# Patient Record
Sex: Male | Born: 1975 | Race: White | Hispanic: No | Marital: Married | Smoking: Former smoker
Health system: Southern US, Community
[De-identification: ages and names within clinical notes are randomized; demographics above are authoritative.]

## PROBLEM LIST (undated history)

## (undated) DIAGNOSIS — K219 Gastro-esophageal reflux disease without esophagitis: Secondary | ICD-10-CM

## (undated) DIAGNOSIS — F419 Anxiety disorder, unspecified: Secondary | ICD-10-CM

## (undated) DIAGNOSIS — I10 Essential (primary) hypertension: Secondary | ICD-10-CM

## (undated) HISTORY — DX: Essential (primary) hypertension: I10

## (undated) HISTORY — DX: Gastro-esophageal reflux disease without esophagitis: K21.9

## (undated) HISTORY — PX: OTHER SURGICAL HISTORY: SHX169

## (undated) HISTORY — DX: Anxiety disorder, unspecified: F41.9

---

## 2002-02-12 HISTORY — PX: CRANIOTOMY: SHX93

## 2012-07-14 ENCOUNTER — Telehealth: Payer: Self-pay | Admitting: Family Medicine

## 2012-07-14 ENCOUNTER — Ambulatory Visit (INDEPENDENT_AMBULATORY_CARE_PROVIDER_SITE_OTHER): Admitting: Family Medicine

## 2012-07-14 DIAGNOSIS — Z23 Encounter for immunization: Secondary | ICD-10-CM

## 2012-07-14 NOTE — Telephone Encounter (Signed)
APPT MADE FOR NURSES- [facility]- TDAP ONLY

## 2012-07-23 ENCOUNTER — Ambulatory Visit (INDEPENDENT_AMBULATORY_CARE_PROVIDER_SITE_OTHER): Admitting: Family Medicine

## 2012-07-23 ENCOUNTER — Telehealth: Payer: Self-pay | Admitting: Nurse Practitioner

## 2012-07-23 ENCOUNTER — Encounter: Payer: Self-pay | Admitting: Family Medicine

## 2012-07-23 VITALS — BP 134/76 | HR 71 | Temp 97.5°F | Ht 70.0 in | Wt 196.2 lb

## 2012-07-23 DIAGNOSIS — L03116 Cellulitis of left lower limb: Secondary | ICD-10-CM

## 2012-07-23 DIAGNOSIS — S91332A Puncture wound without foreign body, left foot, initial encounter: Secondary | ICD-10-CM

## 2012-07-23 DIAGNOSIS — L03119 Cellulitis of unspecified part of limb: Secondary | ICD-10-CM

## 2012-07-23 DIAGNOSIS — S91309A Unspecified open wound, unspecified foot, initial encounter: Secondary | ICD-10-CM

## 2012-07-23 MED ORDER — DOXYCYCLINE HYCLATE 100 MG PO CAPS: 100.0000 mg | ORAL_CAPSULE | Freq: Two times a day (BID) | ORAL | Status: DC

## 2012-07-23 NOTE — Progress Notes (Signed)
  Subjective:    Patient ID: Darren Johnson, male    DOB: Nov 21, 1975, 37 y.o.   MRN: [id number]  HPI Patient comes in today for followup of nail puncture wounds to the left plantar foot area. He came in and received a tetanus shot and was doing well until the past couple of days when his foot became more red swollen and developed some drainage. The redness and erythema has increased today compared to yesterday. He is allergic to penicillin and that hasn't child he had a rash from penicillin.   Review of Systems  Skin: Positive for wound (bottom of L foot stepped on nail > than 1 week ago, now tender with redness).       Objective:   Physical Exam Cellulitis of left lateral foot. Some warmth. Some tenderness. No drainage today.       Assessment & Plan:  1. Puncture wound of left foot, initial encounter  2. Cellulitis of left foot --Doxycycline 100 mg twice daily with food for 2 weeks  Patient Instructions  Soak foot as frequently as possible for 10-15 minutes in warm salty water Elevate foot when at home If any more drainage, do culture and sensitivity on drainage Return to clinic in 5 days for recheck of foot . Return to clinic sooner if worse

## 2012-07-23 NOTE — Patient Instructions (Addendum)
Soak foot as frequently as possible for 10-15 minutes in warm salty water Elevate foot when at home If any more drainage, do culture and sensitivity on drainage Return to clinic in 5 days for recheck of foot . Return to clinic sooner if worse

## 2012-07-23 NOTE — Telephone Encounter (Signed)
APPT MADE

## 2012-07-28 ENCOUNTER — Ambulatory Visit: Admitting: Physician Assistant

## 2012-08-27 ENCOUNTER — Other Ambulatory Visit: Payer: Self-pay | Admitting: Family Medicine

## 2012-09-25 ENCOUNTER — Other Ambulatory Visit: Payer: Self-pay | Admitting: Family Medicine

## 2013-03-09 ENCOUNTER — Other Ambulatory Visit: Payer: Self-pay | Admitting: Family Medicine

## 2013-03-16 ENCOUNTER — Other Ambulatory Visit: Payer: Self-pay | Admitting: Family Medicine

## 2013-03-16 ENCOUNTER — Telehealth: Payer: Self-pay | Admitting: Family Medicine

## 2013-03-17 MED ORDER — DEXLANSOPRAZOLE 60 MG PO CPDR: 60.0000 mg | DELAYED_RELEASE_CAPSULE | Freq: Every day | ORAL | Status: DC

## 2013-03-17 NOTE — Telephone Encounter (Signed)
done

## 2013-04-13 ENCOUNTER — Other Ambulatory Visit: Payer: Self-pay | Admitting: Family Medicine

## 2013-04-15 NOTE — Telephone Encounter (Signed)
Last seen 07/23/12  [facility]

## 2013-05-12 ENCOUNTER — Other Ambulatory Visit: Payer: Self-pay | Admitting: Family Medicine

## 2013-06-12 ENCOUNTER — Other Ambulatory Visit: Payer: Self-pay | Admitting: Family Medicine

## 2014-08-10 ENCOUNTER — Telehealth: Payer: Self-pay | Admitting: Family Medicine

## 2014-08-11 ENCOUNTER — Telehealth: Payer: Self-pay | Admitting: Family Medicine

## 2014-08-12 ENCOUNTER — Ambulatory Visit: Admitting: Family Medicine

## 2014-09-20 ENCOUNTER — Telehealth: Payer: Self-pay | Admitting: Family Medicine

## 2017-05-22 ENCOUNTER — Telehealth: Payer: Self-pay | Admitting: Family Medicine

## 2017-05-22 NOTE — Telephone Encounter (Signed)
appt made

## 2017-05-22 NOTE — Telephone Encounter (Signed)
Pt wife wants him to see [facility] for CPE can this be scheduled. Has not been seen since 2016?

## 2017-06-17 ENCOUNTER — Encounter: Payer: Self-pay | Admitting: Family Medicine

## 2017-06-17 ENCOUNTER — Ambulatory Visit (INDEPENDENT_AMBULATORY_CARE_PROVIDER_SITE_OTHER)

## 2017-06-17 ENCOUNTER — Ambulatory Visit (INDEPENDENT_AMBULATORY_CARE_PROVIDER_SITE_OTHER): Admitting: Family Medicine

## 2017-06-17 VITALS — BP 123/63 | HR 71 | Temp 97.8°F | Ht 71.0 in | Wt 212.0 lb

## 2017-06-17 DIAGNOSIS — Z0000 Encounter for general adult medical examination without abnormal findings: Secondary | ICD-10-CM

## 2017-06-17 DIAGNOSIS — R5383 Other fatigue: Secondary | ICD-10-CM

## 2017-06-17 DIAGNOSIS — N400 Enlarged prostate without lower urinary tract symptoms: Secondary | ICD-10-CM

## 2017-06-17 LAB — URINALYSIS, COMPLETE
Bilirubin, UA: NEGATIVE
Glucose, UA: NEGATIVE
Ketones, UA: NEGATIVE
LEUKOCYTES UA: NEGATIVE
NITRITE UA: NEGATIVE
PH UA: 7 (ref 5.0–7.5)
PROTEIN UA: NEGATIVE
RBC UA: NEGATIVE
Specific Gravity, UA: 1.02 (ref 1.005–1.030)
Urobilinogen, Ur: 0.2 mg/dL (ref 0.2–1.0)
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
## 2017-06-17 NOTE — Patient Instructions (Addendum)
Continue current medications. Continue good therapeutic lifestyle changes which include good diet and exercise. Fall precautions discussed with patient. If an FOBT was given today- please return it to our front desk. If you are over 40 years old - you may need Prevnar 13 or the adult Pneumonia vaccine.  **Flu shots are available--- please call and schedule a FLU-CLINIC appointment**  After your visit with [place] today you will receive a survey in the mail or online from [employer] regarding your care with [place]. Please take a moment to fill this out. Your feedback is very important to [place] as you can help [place] better understand your patient needs as well as improve your experience and satisfaction. WE CARE ABOUT YOU!!!   Continue to eat healthy drink plenty of water and reduce sugar in your diet Stay active physically We will call with lab work results and make further recommendations regarding diet pending results of lab work Reduce alcohol in the diet as much as possible and also reduce caffeine intake If you are going to have something spicy or alcohol in conjunction with spicy food you may want to take some Zantac or ranitidine prior to that meal. May want to consider trying some over-the-counter Flonase and if this helps his nasal congestion call us and we will call in a prescription which will be cheaper than the over-the-counter medicine.  He will use 1 to 2 sprays daily at bedtime to each nostril

## 2017-06-22 ENCOUNTER — Other Ambulatory Visit

## 2017-06-22 DIAGNOSIS — R5383 Other fatigue: Secondary | ICD-10-CM

## 2017-06-22 DIAGNOSIS — Z0000 Encounter for general adult medical examination without abnormal findings: Secondary | ICD-10-CM

## 2017-06-24 ENCOUNTER — Other Ambulatory Visit: Payer: Self-pay | Admitting: *Deleted

## 2017-06-24 DIAGNOSIS — R7989 Other specified abnormal findings of blood chemistry: Secondary | ICD-10-CM

## 2017-06-24 DIAGNOSIS — E875 Hyperkalemia: Secondary | ICD-10-CM

## 2017-06-24 DIAGNOSIS — R945 Abnormal results of liver function studies: Secondary | ICD-10-CM

## 2017-06-24 LAB — CBC WITH DIFFERENTIAL/PLATELET
BASOS ABS: 0 10*3/uL (ref 0.0–0.2)
BASOS: 1 %
EOS (ABSOLUTE): 0.2 10*3/uL (ref 0.0–0.4)
Eos: 3 %
Hematocrit: 48 % (ref 37.5–51.0)
Hemoglobin: 16.4 g/dL (ref 13.0–17.7)
Immature Grans (Abs): 0 10*3/uL (ref 0.0–0.1)
Immature Granulocytes: 0 %
Lymphocytes Absolute: 1.7 10*3/uL (ref 0.7–3.1)
Lymphs: 34 %
MCH: 31.4 pg (ref 26.6–33.0)
MCHC: 34.2 g/dL (ref 31.5–35.7)
MCV: 92 fL (ref 79–97)
MONOS ABS: 0.6 10*3/uL (ref 0.1–0.9)
Monocytes: 12 %
NEUTROS ABS: 2.6 10*3/uL (ref 1.4–7.0)
NEUTROS PCT: 50 %
PLATELETS: 306 10*3/uL (ref 150–379)
RBC: 5.23 x10E6/uL (ref 4.14–5.80)
RDW: 13.2 % (ref 12.3–15.4)
WBC: 5.2 10*3/uL (ref 3.4–10.8)

## 2017-06-24 LAB — LIPID PANEL
CHOL/HDL RATIO: 3.6 ratio (ref 0.0–5.0)
CHOLESTEROL TOTAL: 225 mg/dL — AB (ref 100–199)
HDL: 62 mg/dL (ref >39)
LDL CALC: 153 mg/dL — AB (ref 0–99)
Triglycerides: 51 mg/dL (ref 0–149)
VLDL CHOLESTEROL CAL: 10 mg/dL (ref 5–40)

## 2017-06-24 LAB — BMP8+EGFR
BUN / CREAT RATIO: 20 (ref 9–20)
BUN: 23 mg/dL — AB (ref 6–20)
CALCIUM: 9.3 mg/dL (ref 8.7–10.2)
CHLORIDE: 103 mmol/L (ref 96–106)
CO2: 26 mmol/L (ref 20–29)
CREATININE: 1.17 mg/dL (ref 0.76–1.27)
GFR calc Af Amer: 90 mL/min/{1.73_m2} (ref >59)
GFR, EST NON AFRICAN AMERICAN: 78 mL/min/{1.73_m2} (ref >59)
Glucose: 110 mg/dL — ABNORMAL HIGH (ref 65–99)
Potassium: 5.6 mmol/L — ABNORMAL HIGH (ref 3.5–5.2)
Sodium: 141 mmol/L (ref 134–144)

## 2017-06-24 LAB — PSA, TOTAL AND FREE
PROSTATE SPECIFIC AG, SERUM: 2.1 ng/mL (ref 0.0–4.0)
PSA, Free Pct: 9 %
PSA, Free: 0.19 ng/mL

## 2017-06-24 LAB — THYROID PANEL WITH TSH
FREE THYROXINE INDEX: 1.6 (ref 1.2–4.9)
T3 UPTAKE RATIO: 30 % (ref 24–39)
T4 TOTAL: 5.4 ug/dL (ref 4.5–12.0)
TSH: 0.887 u[IU]/mL (ref 0.450–4.500)

## 2017-06-24 LAB — HEPATIC FUNCTION PANEL
ALBUMIN: 4.5 g/dL (ref 3.5–5.5)
ALK PHOS: 157 IU/L — AB (ref 39–117)
ALT: 26 IU/L (ref 0–44)
AST: 12 IU/L (ref 0–40)
BILIRUBIN TOTAL: 0.3 mg/dL (ref 0.0–1.2)
Bilirubin, Direct: 0.09 mg/dL (ref 0.00–0.40)
TOTAL PROTEIN: 7 g/dL (ref 6.0–8.5)

## 2017-06-24 LAB — TESTOSTERONE,FREE AND TOTAL
Testosterone, Free: 13.1 pg/mL (ref 8.7–25.1)
Testosterone: 399 ng/dL (ref 264–916)

## 2017-06-24 LAB — VITAMIN D 25 HYDROXY (VIT D DEFICIENCY, FRACTURES): Vit D, 25-Hydroxy: 36.7 ng/mL (ref 30.0–100.0)

## 2017-09-22 ENCOUNTER — Encounter: Payer: Self-pay | Admitting: Family Medicine

## 2018-01-08 ENCOUNTER — Other Ambulatory Visit

## 2018-01-08 DIAGNOSIS — E7800 Pure hypercholesterolemia, unspecified: Secondary | ICD-10-CM

## 2018-01-09 LAB — SPECIMEN STATUS

## 2018-01-10 LAB — CMP14+EGFR
A/G RATIO: 1.7 (ref 1.2–2.2)
ALK PHOS: 165 IU/L — AB (ref 39–117)
ALT: 31 IU/L (ref 0–44)
AST: 12 IU/L (ref 0–40)
Albumin: 4.4 g/dL (ref 3.5–5.5)
BUN / CREAT RATIO: 11 (ref 9–20)
BUN: 16 mg/dL (ref 6–24)
Bilirubin Total: 0.6 mg/dL (ref 0.0–1.2)
CALCIUM: 9.6 mg/dL (ref 8.7–10.2)
CO2: 24 mmol/L (ref 20–29)
CREATININE: 1.43 mg/dL — AB (ref 0.76–1.27)
Chloride: 102 mmol/L (ref 96–106)
GFR calc Af Amer: 70 mL/min/{1.73_m2} (ref >59)
GFR, EST NON AFRICAN AMERICAN: 61 mL/min/{1.73_m2} (ref >59)
GLOBULIN, TOTAL: 2.6 g/dL (ref 1.5–4.5)
Glucose: 98 mg/dL (ref 65–99)
Potassium: 4.7 mmol/L (ref 3.5–5.2)
SODIUM: 140 mmol/L (ref 134–144)
Total Protein: 7 g/dL (ref 6.0–8.5)

## 2018-01-10 LAB — LIPID PANEL
CHOL/HDL RATIO: 2.7 ratio (ref 0.0–5.0)
Cholesterol, Total: 128 mg/dL (ref 100–199)
HDL: 48 mg/dL (ref >39)
LDL CALC: 70 mg/dL (ref 0–99)
TRIGLYCERIDES: 49 mg/dL (ref 0–149)
VLDL Cholesterol Cal: 10 mg/dL (ref 5–40)

## 2018-01-10 LAB — SPECIMEN STATUS REPORT

## 2018-01-13 LAB — CBC WITH DIFFERENTIAL/PLATELET
BASOS: 1 %
Basophils Absolute: 0.1 10*3/uL (ref 0.0–0.2)
EOS (ABSOLUTE): 0.1 10*3/uL (ref 0.0–0.4)
EOS: 3 %
HEMOGLOBIN: 15.9 g/dL (ref 13.0–17.7)
Hematocrit: 48.4 % (ref 37.5–51.0)
IMMATURE GRANS (ABS): 0 10*3/uL (ref 0.0–0.1)
IMMATURE GRANULOCYTES: 0 %
LYMPHS ABS: 1.7 10*3/uL (ref 0.7–3.1)
Lymphs: 42 %
MCH: 30.8 pg (ref 26.6–33.0)
MCHC: 32.9 g/dL (ref 31.5–35.7)
MCV: 94 fL (ref 79–97)
MONOCYTES: 13 %
Monocytes Absolute: 0.6 10*3/uL (ref 0.1–0.9)
NEUTROS ABS: 1.7 10*3/uL (ref 1.4–7.0)
Neutrophils: 41 %
PLATELETS: 271 10*3/uL (ref 150–450)
RBC: 5.17 x10E6/uL (ref 4.14–5.80)
RDW: 12.1 % — ABNORMAL LOW (ref 12.3–15.4)
WBC: 4.2 10*3/uL (ref 3.4–10.8)

## 2018-01-13 LAB — SPECIMEN STATUS REPORT

## 2018-02-10 ENCOUNTER — Other Ambulatory Visit

## 2018-02-10 DIAGNOSIS — E875 Hyperkalemia: Secondary | ICD-10-CM

## 2018-02-10 DIAGNOSIS — R945 Abnormal results of liver function studies: Secondary | ICD-10-CM

## 2018-02-10 DIAGNOSIS — R7989 Other specified abnormal findings of blood chemistry: Secondary | ICD-10-CM

## 2018-02-11 LAB — BMP8+EGFR
BUN/Creatinine Ratio: 15 (ref 9–20)
BUN: 17 mg/dL (ref 6–24)
CALCIUM: 9.7 mg/dL (ref 8.7–10.2)
CHLORIDE: 102 mmol/L (ref 96–106)
CO2: 26 mmol/L (ref 20–29)
Creatinine, Ser: 1.11 mg/dL (ref 0.76–1.27)
GFR calc Af Amer: 96 mL/min/{1.73_m2} (ref >59)
GFR calc non Af Amer: 83 mL/min/{1.73_m2} (ref >59)
GLUCOSE: 95 mg/dL (ref 65–99)
Potassium: 4.4 mmol/L (ref 3.5–5.2)
Sodium: 140 mmol/L (ref 134–144)

## 2018-02-11 LAB — HEPATIC FUNCTION PANEL
ALT: 25 IU/L (ref 0–44)
AST: 10 IU/L (ref 0–40)
Albumin: 4.7 g/dL (ref 3.5–5.5)
Alkaline Phosphatase: 146 IU/L — ABNORMAL HIGH (ref 39–117)
Bilirubin Total: 0.4 mg/dL (ref 0.0–1.2)
Bilirubin, Direct: 0.1 mg/dL (ref 0.00–0.40)
TOTAL PROTEIN: 6.9 g/dL (ref 6.0–8.5)

## 2018-02-13 ENCOUNTER — Other Ambulatory Visit: Payer: Self-pay | Admitting: *Deleted

## 2018-02-13 DIAGNOSIS — R748 Abnormal levels of other serum enzymes: Secondary | ICD-10-CM

## 2018-02-20 ENCOUNTER — Other Ambulatory Visit: Payer: Self-pay | Admitting: *Deleted

## 2018-02-25 ENCOUNTER — Ambulatory Visit (HOSPITAL_COMMUNITY): Payer: Self-pay

## 2018-02-27 ENCOUNTER — Ambulatory Visit (HOSPITAL_COMMUNITY)
Admission: RE | Admit: 2018-02-27 | Discharge: 2018-02-27 | Disposition: A | Discharge status: Home / Self Care | Source: Ambulatory Visit | Attending: Family Medicine | Admitting: Family Medicine

## 2018-02-27 DIAGNOSIS — R748 Abnormal levels of other serum enzymes: Secondary | ICD-10-CM | POA: Diagnosis present

## 2018-06-25 ENCOUNTER — Ambulatory Visit: Admitting: Family Medicine

## 2018-07-21 ENCOUNTER — Ambulatory Visit: Admitting: Family Medicine

## 2019-07-09 IMAGING — US ULTRASOUND ABDOMEN COMPLETE
1 series · 14 of 25 positions shown · non-contrast
Comparison: None.

CLINICAL DATA: Elevated liver enzymes

EXAM:
ABDOMEN ULTRASOUND COMPLETE

[Series 1: ultrasound abdomen complete · 0.15mm/px · 14 of 92 slices shown]
[im 1/92]
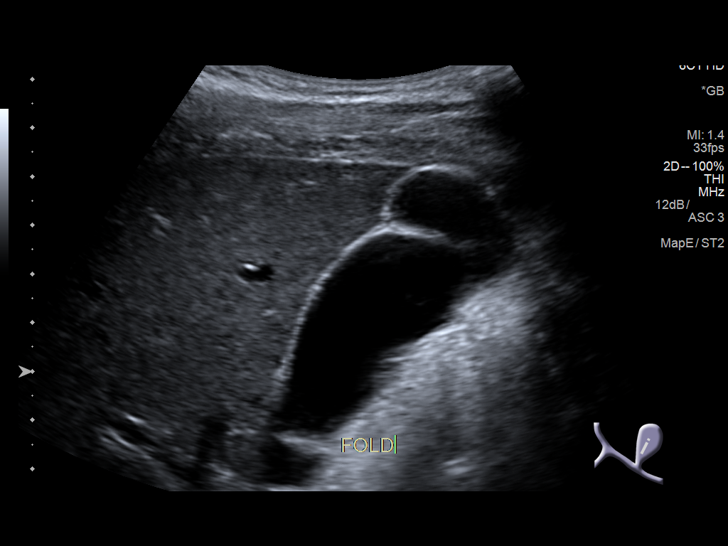
[im 8/92]
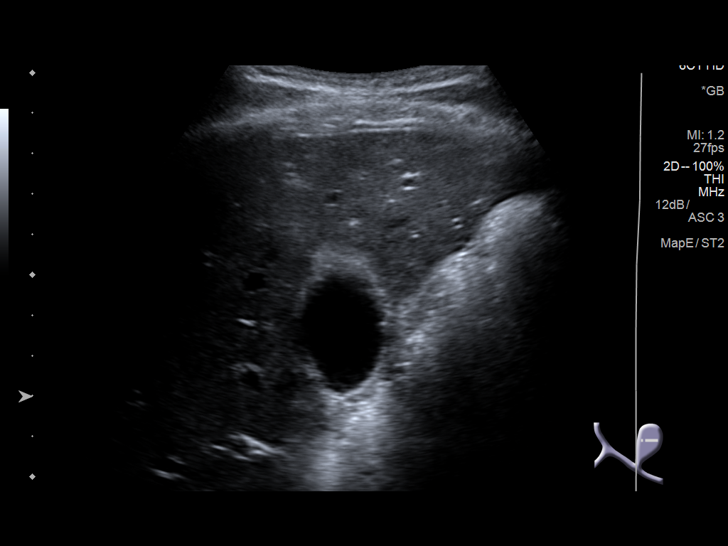
[im 16/92]
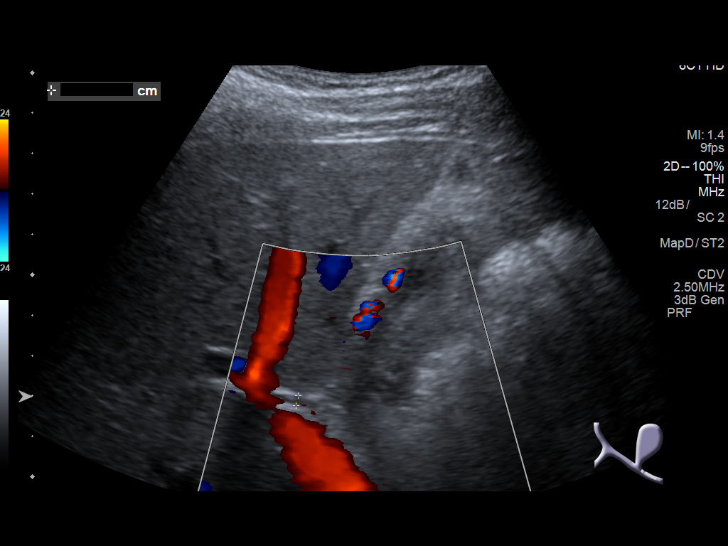
[im 23/92]
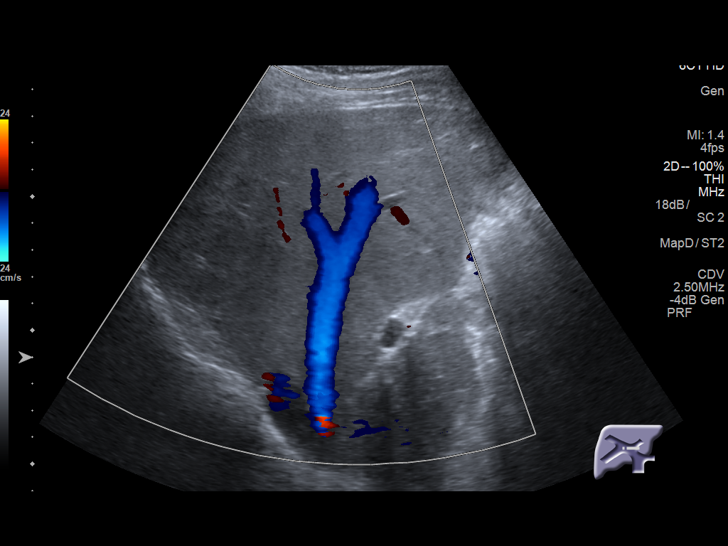
[im 31/92]
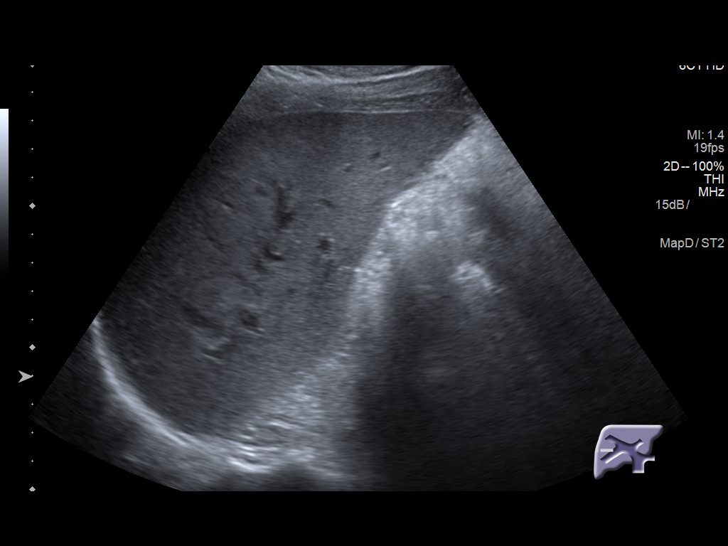
[im 35/92]
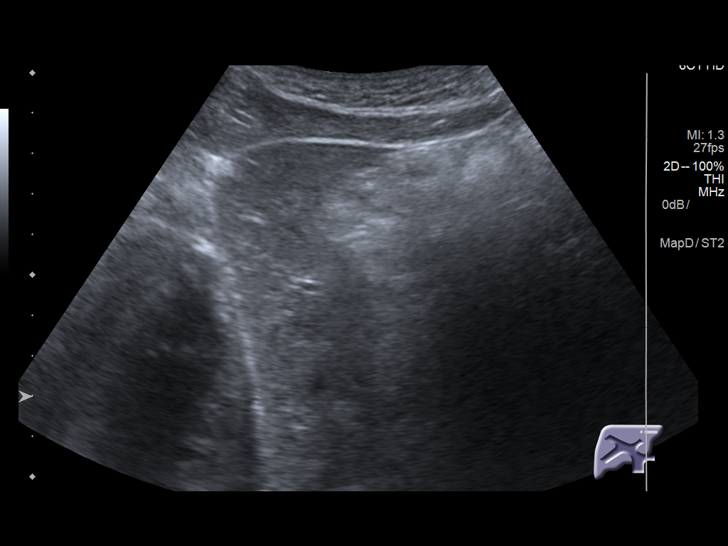
[im 42/92]
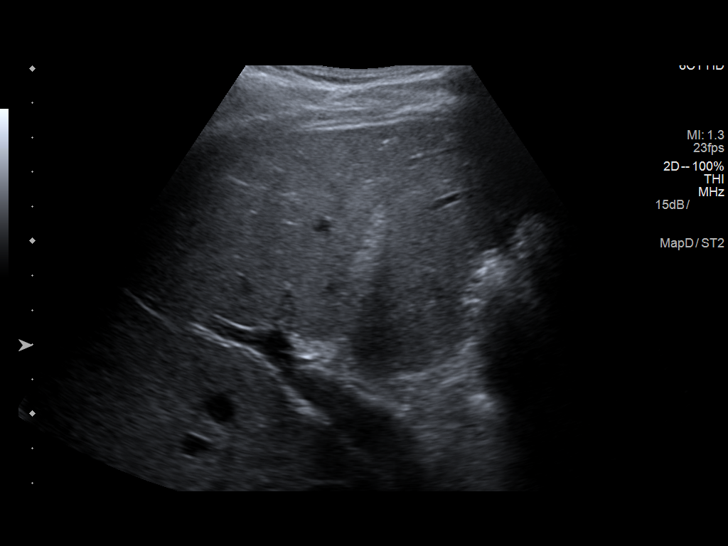
[im 50/92]
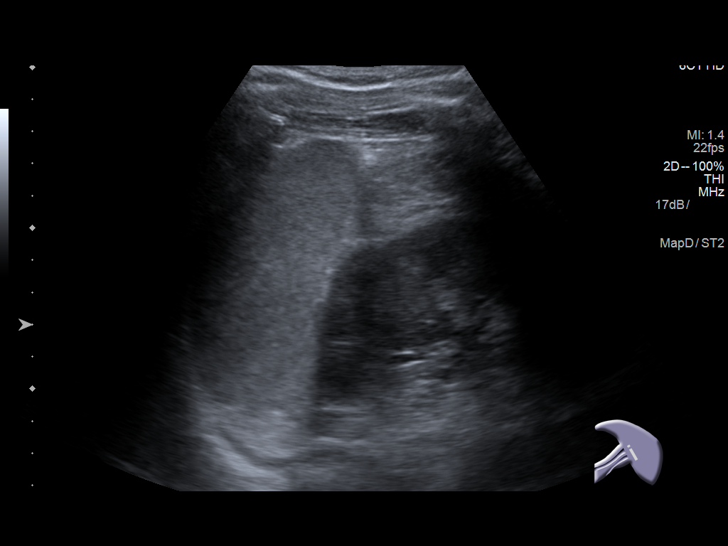
[im 57/92]
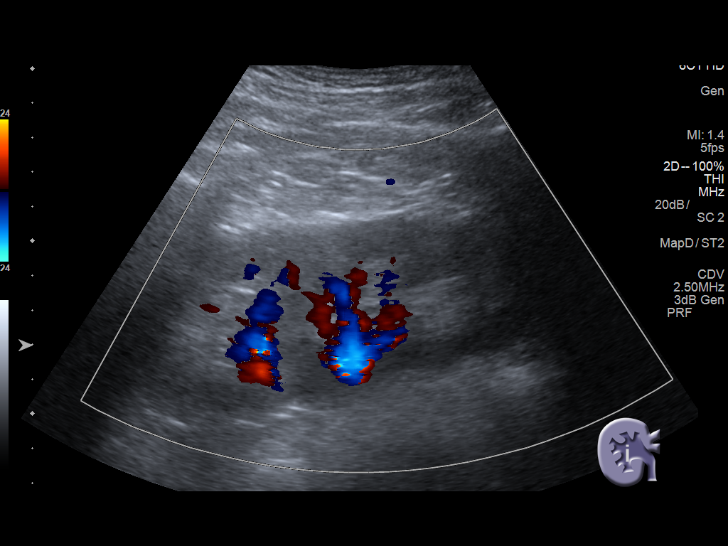
[im 61/92]
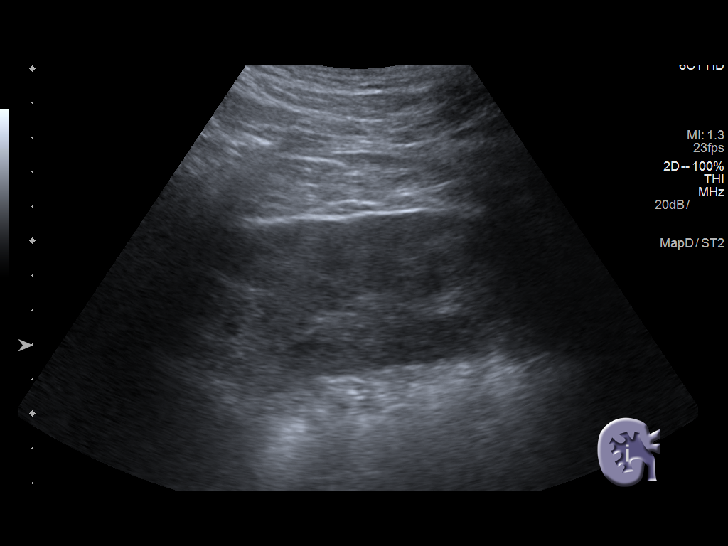
[im 69/92]
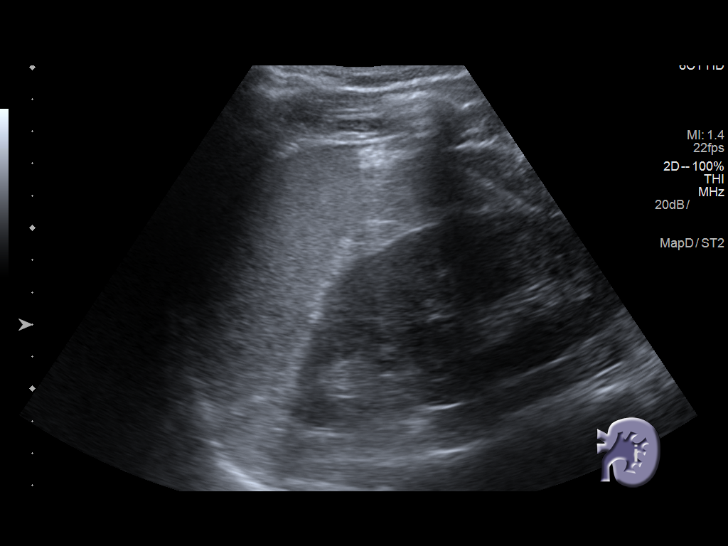
[im 76/92]
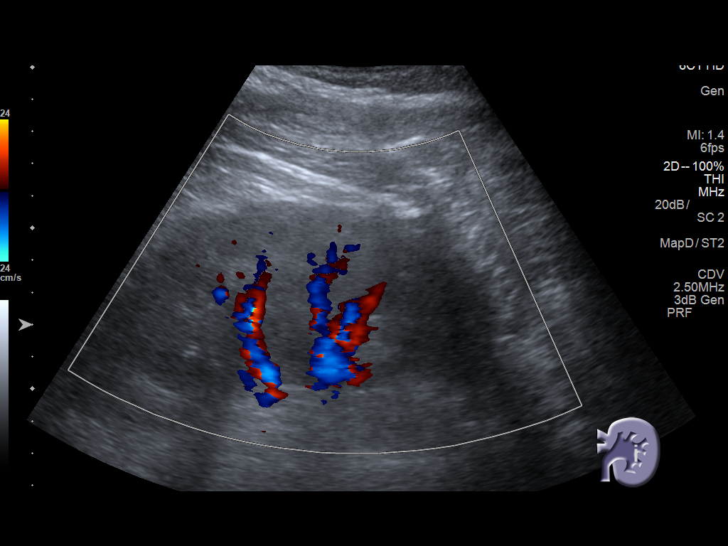
[im 84/92]
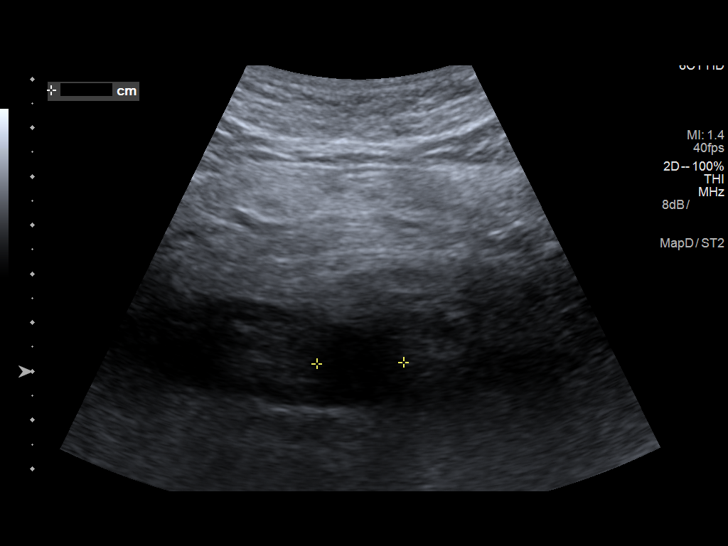
[im 92/92]
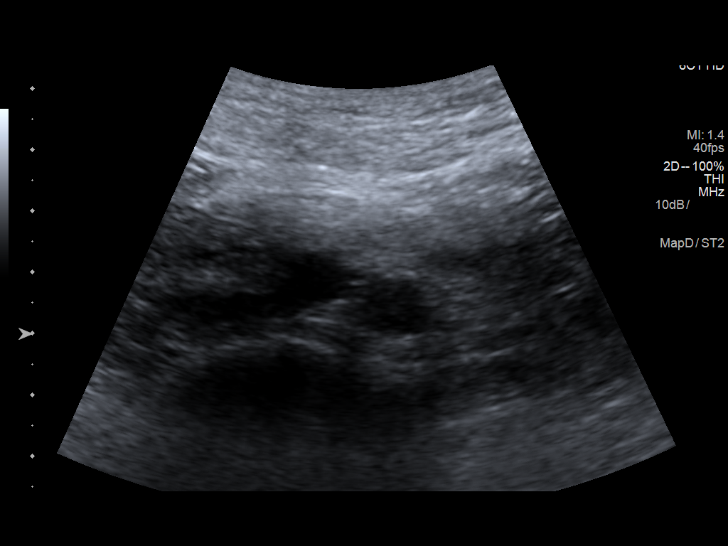

[14 of 25 positions shown; findings below may reference images not displayed]

FINDINGS: Gallbladder: No gallstones or wall thickening visualized. There is
no pericholecystic fluid. No sonographic Murphy sign noted by
sonographer.

Common bile duct: Diameter: 3 mm. No intrahepatic, common hepatic,
or common bile duct dilatation.

Liver: No focal lesion identified. Within normal limits in
parenchymal echogenicity. Portal vein is patent on color Doppler
imaging with normal direction of blood flow towards the liver.

IVC: No abnormality visualized.

Pancreas: Visualized portion unremarkable. Much of pancreas is
obscured by gas.

Spleen: Size and appearance within normal limits.

Right Kidney: Length: 12.2 cm. Echogenicity within normal limits. No
mass or hydronephrosis visualized.

Left Kidney: Length: 13.0 cm. Echogenicity within normal limits. No
mass or hydronephrosis visualized.

Abdominal aorta: No aneurysm visualized.

Other findings: No demonstrable ascites.
IMPRESSION: Much of pancreas obscured by gas. Visualized portions of pancreas
appear unremarkable.

Study otherwise unremarkable.
